# Patient Record
Sex: Female | Born: 1975 | Smoking: Never smoker
Health system: Southern US, Community
[De-identification: ages and names within clinical notes are randomized; demographics above are authoritative.]

## PROBLEM LIST (undated history)

## (undated) DIAGNOSIS — G43909 Migraine, unspecified, not intractable, without status migrainosus: Secondary | ICD-10-CM

## (undated) DIAGNOSIS — F419 Anxiety disorder, unspecified: Secondary | ICD-10-CM

## (undated) DIAGNOSIS — F329 Major depressive disorder, single episode, unspecified: Secondary | ICD-10-CM

## (undated) DIAGNOSIS — M797 Fibromyalgia: Secondary | ICD-10-CM

## (undated) DIAGNOSIS — F32A Depression, unspecified: Secondary | ICD-10-CM

---

## 2004-07-22 ENCOUNTER — Ambulatory Visit: Payer: Self-pay | Admitting: Internal Medicine

## 2004-07-23 ENCOUNTER — Emergency Department: Payer: Self-pay | Admitting: Emergency Medicine

## 2005-11-27 ENCOUNTER — Inpatient Hospital Stay: Payer: Self-pay | Admitting: Internal Medicine

## 2007-04-06 ENCOUNTER — Observation Stay: Payer: Self-pay | Admitting: Obstetrics and Gynecology

## 2007-04-30 ENCOUNTER — Inpatient Hospital Stay: Payer: Self-pay | Admitting: Obstetrics and Gynecology

## 2009-06-11 ENCOUNTER — Ambulatory Visit: Payer: Self-pay | Admitting: Family Medicine

## 2010-08-31 ENCOUNTER — Emergency Department: Payer: Self-pay | Admitting: *Deleted

## 2010-10-19 ENCOUNTER — Emergency Department: Payer: Self-pay | Admitting: Emergency Medicine

## 2012-07-16 IMAGING — US US OB < 14 WEEKS - US OB TV
1 series · 17 of 28 positions shown · non-contrast
Comparison: none

REASON FOR EXAM: pregnancy, bleeding
COMMENTS:

[Series 1: us ob < 14 weeks - us ob tv · 17 of 67 slices shown]
[im 1/67]
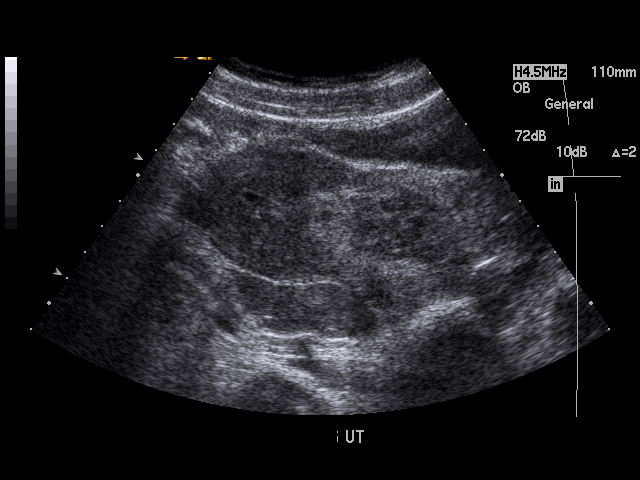
[im 5/67]
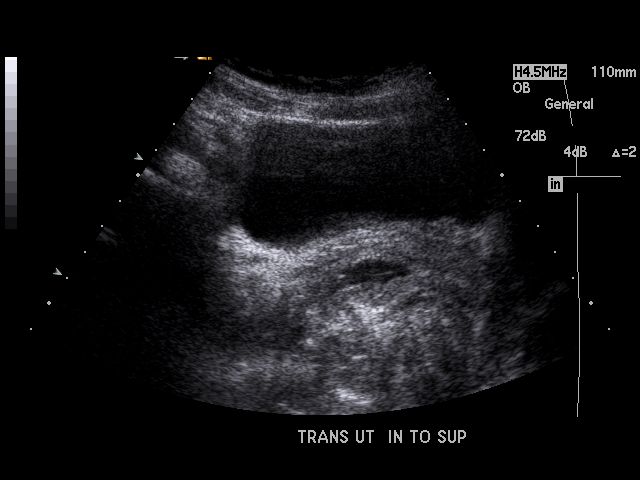
[im 10/67]
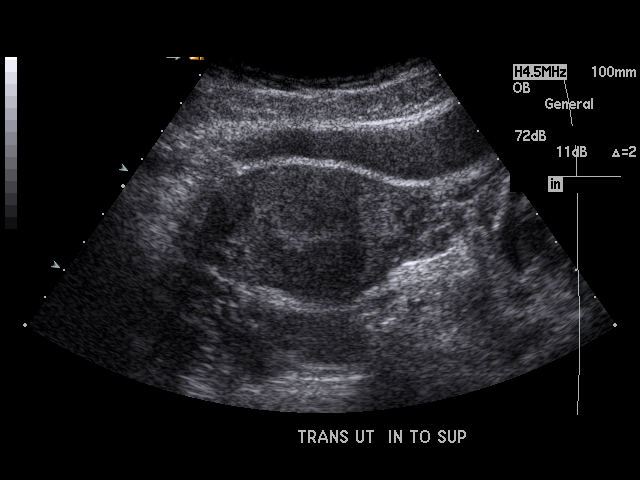
[im 13/67]
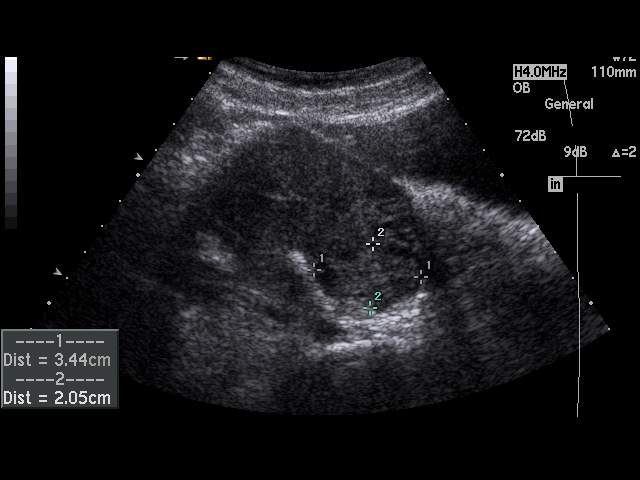
[im 18/67]
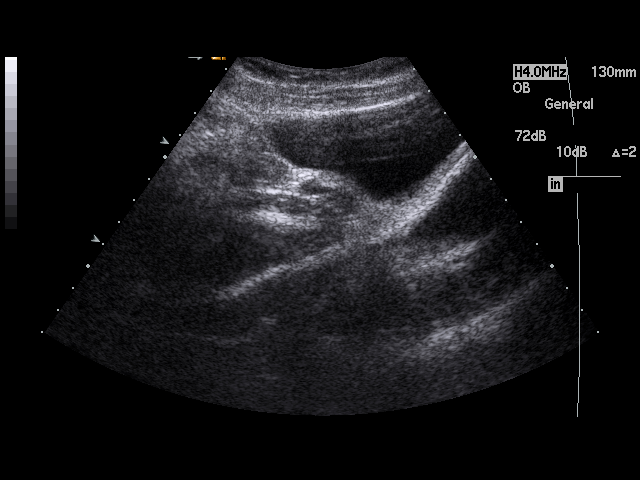
[im 23/67]
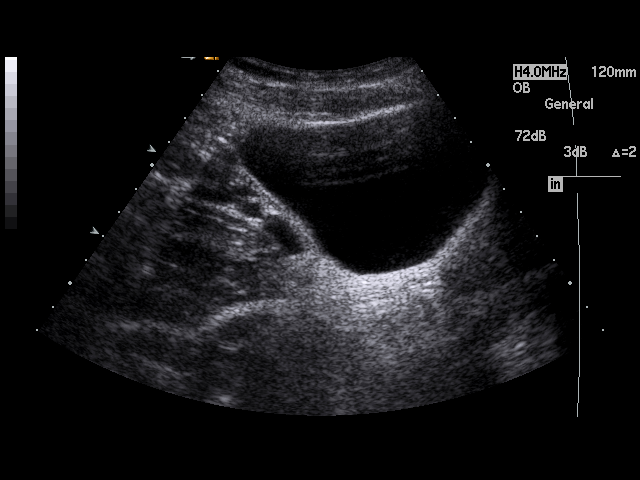
[im 25/67]
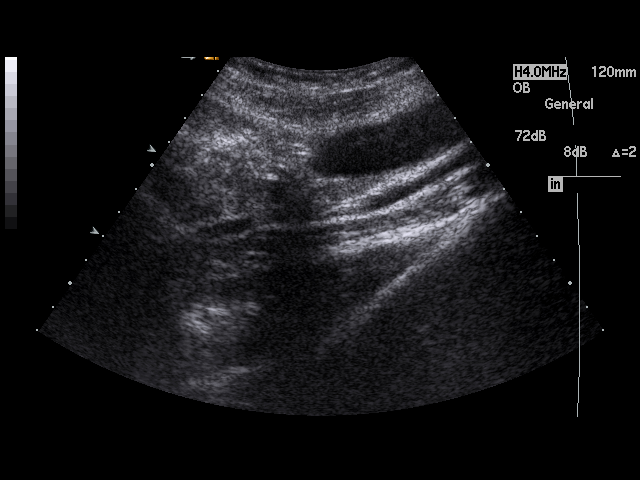
[im 30/67]
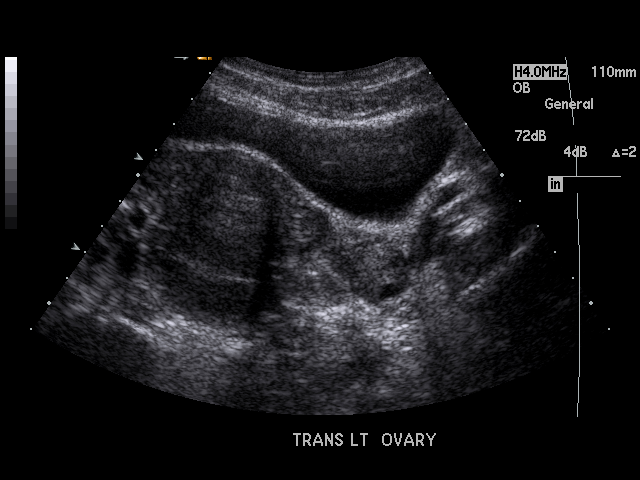
[im 35/67]
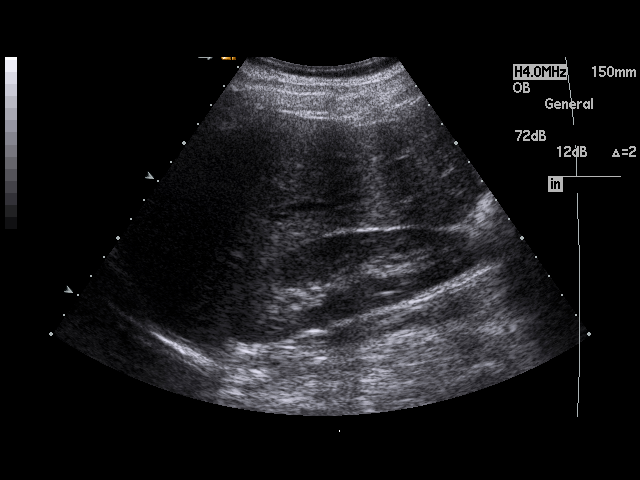
[im 37/67]
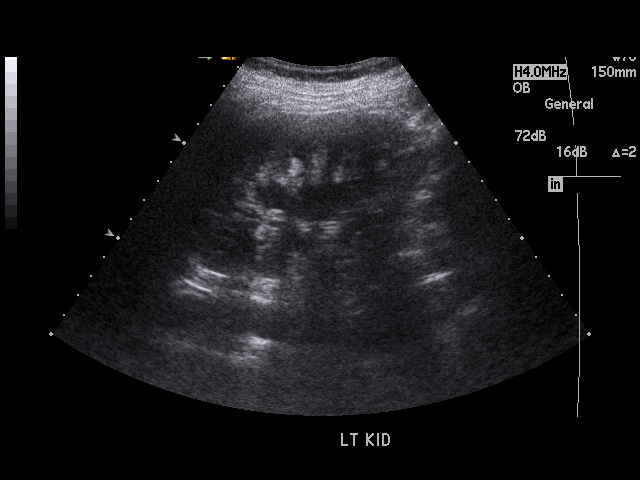
[im 42/67]
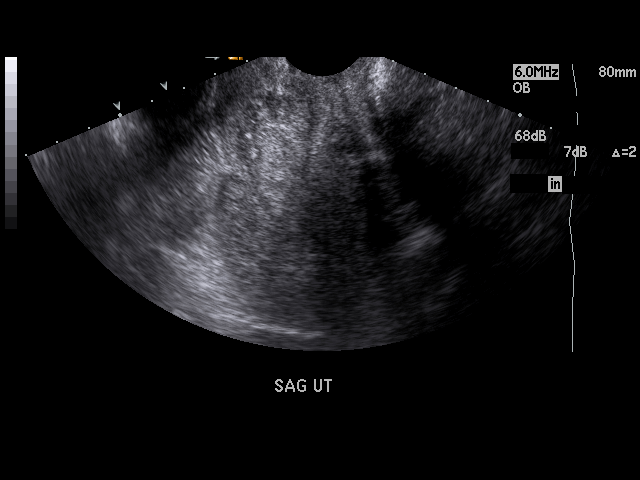
[im 45/67]
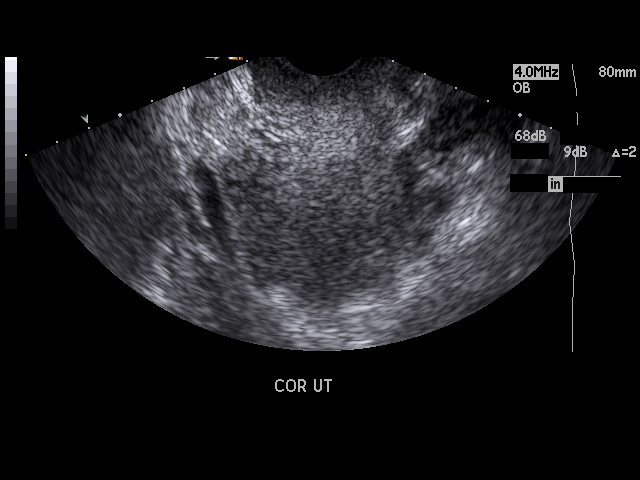
[im 49/67]
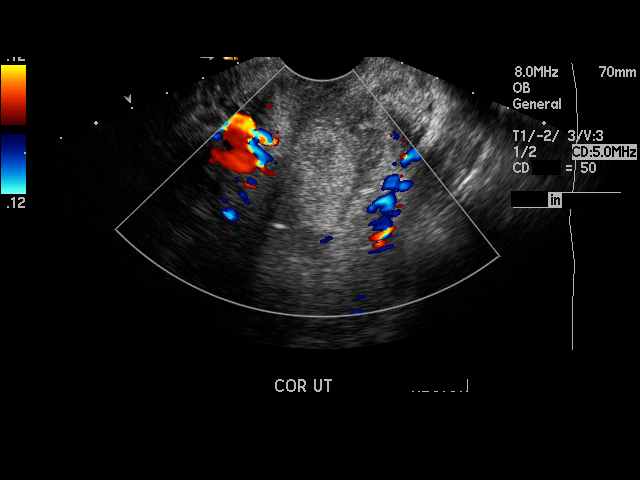
[im 54/67]
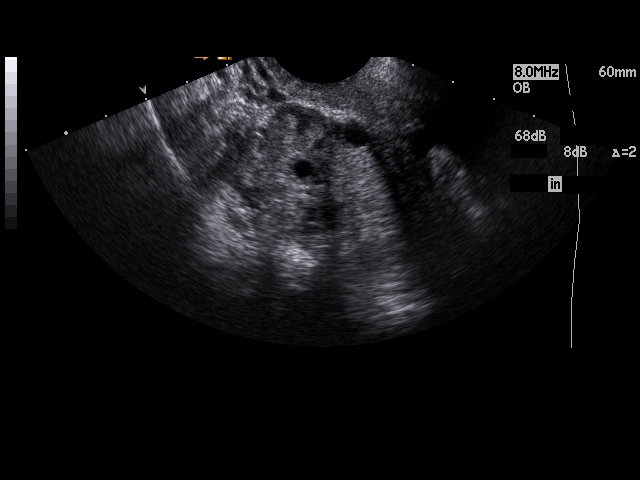
[im 57/67]
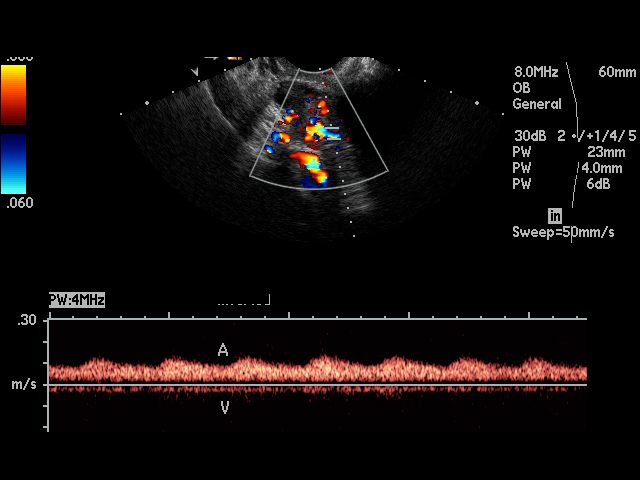
[im 62/67]
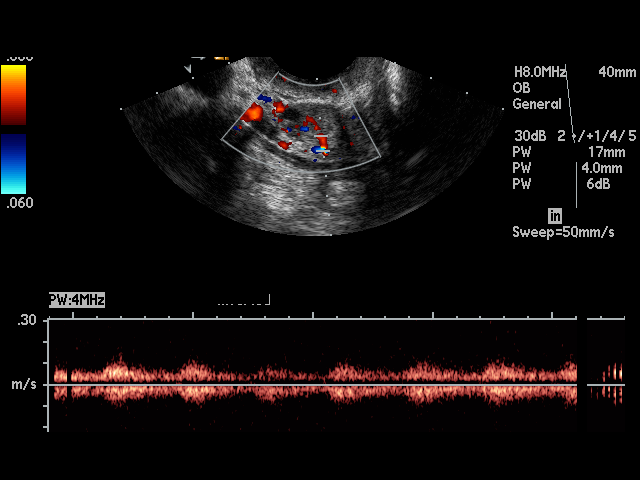
[im 67/67]
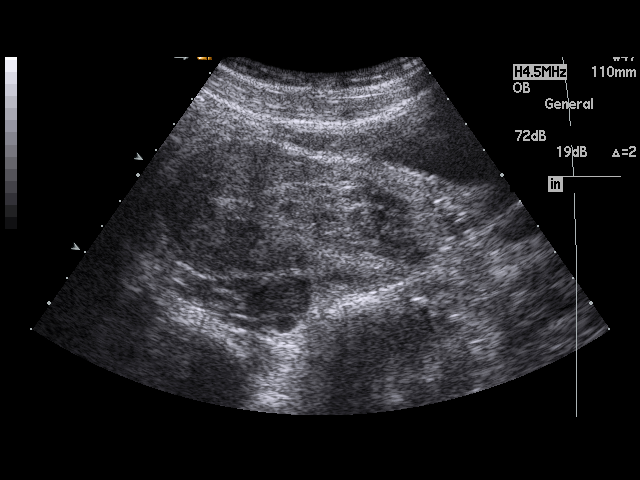

[17 of 28 positions shown; findings below may reference images not displayed]

PROCEDURE:     US  - US OB LESS THAN 14 WEEKS/W TRANS  - October 20, 2010 [DATE]

RESULT:

Transabdominal and endovaginal ultrasound is performed. The right kidney is
unremarkable. Mild, left hydronephrosis is present. The ovaries appear to be
normal in size and echotexture without evidence of torsion. No identifiable
intrauterine pregnancy is evident. Heterogeneous, hyperechogenic material is
demonstrated within the lower uterine segment measuring up to 3.3 mm. There
is no free fluid within the pelvis.
IMPRESSION: 1.  No identifiable intrauterine pregnancy. There may be residual blood
products within the lower uterine segment. Spontaneous abortion is the most
likely consideration. Given the positive Beta-HCG and no intrauterine
pregnancy, the possibility of an ectopic gestation cannot be excluded and
follow-up serial quantitative Beta-HCG is recommended.
2.  Mild, left hydronephrosis.

## 2015-02-16 ENCOUNTER — Encounter: Payer: Self-pay | Admitting: Emergency Medicine

## 2015-02-16 ENCOUNTER — Emergency Department: Payer: Medicare Other

## 2015-02-16 ENCOUNTER — Emergency Department
Admission: EM | Admit: 2015-02-16 | Discharge: 2015-02-16 | Disposition: A | Discharge status: Home / Self Care | Payer: Medicare Other | Attending: Emergency Medicine | Admitting: Emergency Medicine

## 2015-02-16 DIAGNOSIS — R51 Headache: Secondary | ICD-10-CM | POA: Insufficient documentation

## 2015-02-16 DIAGNOSIS — R002 Palpitations: Secondary | ICD-10-CM | POA: Diagnosis present

## 2015-02-16 DIAGNOSIS — F419 Anxiety disorder, unspecified: Secondary | ICD-10-CM | POA: Diagnosis not present

## 2015-02-16 DIAGNOSIS — G4700 Insomnia, unspecified: Secondary | ICD-10-CM | POA: Insufficient documentation

## 2015-02-16 DIAGNOSIS — R079 Chest pain, unspecified: Secondary | ICD-10-CM | POA: Insufficient documentation

## 2015-02-16 HISTORY — DX: Migraine, unspecified, not intractable, without status migrainosus: G43.909

## 2015-02-16 HISTORY — DX: Anxiety disorder, unspecified: F41.9

## 2015-02-16 HISTORY — DX: Major depressive disorder, single episode, unspecified: F32.9

## 2015-02-16 HISTORY — DX: Fibromyalgia: M79.7

## 2015-02-16 HISTORY — DX: Depression, unspecified: F32.A

## 2015-02-16 LAB — BASIC METABOLIC PANEL
ANION GAP: 4 — AB (ref 5–15)
BUN: 11 mg/dL (ref 6–20)
CALCIUM: 9.5 mg/dL (ref 8.9–10.3)
CHLORIDE: 105 mmol/L (ref 101–111)
CO2: 30 mmol/L (ref 22–32)
Creatinine, Ser: 0.91 mg/dL (ref 0.44–1.00)
GFR calc Af Amer: >60 mL/min (ref >60)
GFR calc non Af Amer: >60 mL/min (ref >60)
GLUCOSE: 95 mg/dL (ref 65–99)
POTASSIUM: 4.1 mmol/L (ref 3.5–5.1)
Sodium: 139 mmol/L (ref 135–145)

## 2015-02-16 LAB — CBC
HEMATOCRIT: 39.7 % (ref 35.0–47.0)
HEMOGLOBIN: 13.5 g/dL (ref 12.0–16.0)
MCH: 31.1 pg (ref 26.0–34.0)
MCHC: 34 g/dL (ref 32.0–36.0)
MCV: 91.3 fL (ref 80.0–100.0)
Platelets: 182 10*3/uL (ref 150–440)
RBC: 4.34 MIL/uL (ref 3.80–5.20)
RDW: 12.3 % (ref 11.5–14.5)
WBC: 3.6 10*3/uL (ref 3.6–11.0)

## 2015-02-16 LAB — TROPONIN I: Troponin I: <0.03 ng/mL (ref <0.031)

## 2015-02-16 MED ORDER — IBUPROFEN 800 MG PO TABS
ORAL_TABLET | ORAL | Status: AC
  Filled 2015-02-16: qty 1

## 2015-02-16 MED ORDER — LORAZEPAM 1 MG PO TABS: 1.0000 mg | ORAL_TABLET | Freq: Two times a day (BID) | ORAL | Status: AC

## 2015-02-16 MED ORDER — ZOLPIDEM TARTRATE 10 MG PO TABS: 10.0000 mg | ORAL_TABLET | Freq: Every evening | ORAL | Status: AC | PRN

## 2015-02-16 MED ORDER — IBUPROFEN 800 MG PO TABS
800.0000 mg | ORAL_TABLET | Freq: Once | ORAL | Status: AC
  Administered 2015-02-16: 800 mg via ORAL

## 2015-02-16 NOTE — ED Notes (Signed)
Pt to ed with c/o chest pain and palpitations today.  Pt also reports headache,  States hx of anxiety.

## 2015-02-16 NOTE — ED Provider Notes (Signed)
[facility] Emergency Department Provider Note     Time seen: ----------------------------------------- 7:55 PM on 02/16/2015 -----------------------------------------    I have reviewed the triage vital signs and the nursing notes.   HISTORY  Chief Complaint Palpitations    HPI Renee Nichols is a 40 y.o. female who presents ER with chest pain and palpitations today. Patient is also reporting headacheearlier. Patient states she has a lot of anxiety and doesn't sleep very well at night. Patient is tried taking natural supplements in the past this hasn't helped. She states she lost her son this year to violence.   Past Medical History  Diagnosis Date  . Migraines   . Anxiety   . Fibromyalgia   . Depression     There are no active problems to display for this patient.   History reviewed. No pertinent past surgical history.  Allergies Sulfa antibiotics  Social History Social History  Substance Use Topics  . Smoking status: Never Smoker   . Smokeless tobacco: None  . Alcohol Use: Yes    Review of Systems Constitutional: Negative for fever. Eyes: Negative for visual changes. ENT: Negative for sore throat. Cardiovascular: Positive for chest pain, palpitations Respiratory: Negative for shortness of breath. Gastrointestinal: Negative for abdominal pain, vomiting and diarrhea. Genitourinary: Negative for dysuria. Musculoskeletal: Negative for back pain. Skin: Negative for rash. Neurological: Negative for headaches, focal weakness or numbness. Psychiatric: Positive for anxiety  10-point ROS otherwise negative.  ____________________________________________   PHYSICAL EXAM:  VITAL SIGNS: ED Triage Vitals  Enc Vitals Group     BP 02/16/15 1848 119/85 mmHg     Pulse Rate 02/16/15 1848 96     Resp 02/16/15 1848 20     Temp 02/16/15 1848 98.9 F (37.2 C)     Temp Source 02/16/15 1848 Oral     SpO2 02/16/15 1848 100 %     Weight  02/16/15 1848 165 lb (74.844 kg)     Height 02/16/15 1848  (1.702 m)     Head Cir --      Peak Flow --      Pain Score 02/16/15 1849 7     Pain Loc --      Pain Edu? --      Excl. in GC? --     Constitutional: Alert and oriented. Mildly anxious, no acute distress Eyes: Conjunctivae are normal. PERRL. Normal extraocular movements. ENT   Head: Normocephalic and atraumatic.   Nose: No congestion/rhinnorhea.   Mouth/Throat: Mucous membranes are moist.   Neck: No stridor. Cardiovascular: Normal rate, regular rhythm. Normal and symmetric distal pulses are present in all extremities. No murmurs, rubs, or gallops. Respiratory: Normal respiratory effort without tachypnea nor retractions. Breath sounds are clear and equal bilaterally. No wheezes/rales/rhonchi. Gastrointestinal: Soft and nontender. No distention. No abdominal bruits.  Musculoskeletal: Nontender with normal range of motion in all extremities. No joint effusions.  No lower extremity tenderness nor edema. Neurologic:  Normal speech and language. No gross focal neurologic deficits are appreciated. Speech is normal. No gait instability. Skin:  Skin is warm, dry and intact. No rash noted. Psychiatric: Mood and affect are normal. Speech and behavior are normal. Patient exhibits appropriate insight and judgment. ____________________________________________  EKG: Interpreted by me. Normal sinus rhythm with rate of 93 bpm, normal PR interval, normal QRS, normal QT interval. Normal axis.  ____________________________________________  ED COURSE:  Pertinent labs & imaging results that were available during my care of the patient were reviewed by me and  considered in my medical decision making (see chart for details). Patient is in no acute distress, symptoms likely anxiety and sleep deprivation related. We'll check cardiac labs and reevaluate. ____________________________________________    LABS (pertinent  positives/negatives)  Labs Reviewed  BASIC METABOLIC PANEL - Abnormal; Notable for the following:    Anion gap 4 (*)    All other components within normal limits  TROPONIN I  CBC    RADIOLOGY  Chest x-ray IMPRESSION: No abnormality noted. ____________________________________________  FINAL ASSESSMENT AND PLAN  Palpitations, anxiety, insomnia  Plan: Patient with labs and imaging as dictated above. I advised the patient to exercise at least 4 days a week as this will help some with her anxiety. Also prescribed Ambien for her and Ativan to take as needed. She is stable for outpatient follow-up.   Emily Filbert, MD   Emily Filbert, MD 02/16/15 2004

## 2015-02-16 NOTE — Discharge Instructions (Signed)
Palpitations A palpitation is the feeling that your heartbeat is irregular or is faster than normal. It may feel like your heart is fluttering or skipping a beat. Palpitations are usually not a serious problem. However, in some cases, you may need further medical evaluation. CAUSES  Palpitations can be caused by:  Smoking.  Caffeine or other stimulants, such as diet pills or energy drinks.  Alcohol.  Stress and anxiety.  Strenuous physical activity.  Fatigue.  Certain medicines.  Heart disease, especially if you have a history of irregular heart rhythms (arrhythmias), such as atrial fibrillation, atrial flutter, or supraventricular tachycardia.  An improperly working pacemaker or defibrillator. DIAGNOSIS  To find the cause of your palpitations, your health care provider will take your medical history and perform a physical exam. Your health care provider may also have you take a test called an ambulatory electrocardiogram (ECG). An ECG records your heartbeat patterns over a 24-hour period. You may also have other tests, such as:  Transthoracic echocardiogram (TTE). During echocardiography, sound waves are used to evaluate how blood flows through your heart.  Transesophageal echocardiogram (TEE).  Cardiac monitoring. This allows your health care provider to monitor your heart rate and rhythm in real time.  Holter monitor. This is a portable device that records your heartbeat and can help diagnose heart arrhythmias. It allows your health care provider to track your heart activity for several days, if needed.  Stress tests by exercise or by giving medicine that makes the heart beat faster. TREATMENT  Treatment of palpitations depends on the cause of your symptoms and can vary greatly. Most cases of palpitations do not require any treatment other than time, relaxation, and monitoring your symptoms. Other causes, such as atrial fibrillation, atrial flutter, or supraventricular  tachycardia, usually require further treatment. HOME CARE INSTRUCTIONS   Avoid:  Caffeinated coffee, tea, soft drinks, diet pills, and energy drinks.  Chocolate.  Alcohol.  Stop smoking if you smoke.  Reduce your stress and anxiety. Things that can help you relax include:  A method of controlling things in your body, such as your heartbeats, with your mind (biofeedback).  Yoga.  Meditation.  Physical activity such as swimming, jogging, or walking.  Get plenty of rest and sleep. SEEK MEDICAL CARE IF:   You continue to have a fast or irregular heartbeat beyond 24 hours.  Your palpitations occur more often. SEEK IMMEDIATE MEDICAL CARE IF:  You have chest pain or shortness of breath.  You have a severe headache.  You feel dizzy or you faint. MAKE SURE YOU:  Understand these instructions.  Will watch your condition.  Will get help right away if you are not doing well or get worse.   This information is not intended to replace advice given to you by your health care provider. Make sure you discuss any questions you have with your health care provider.   Document Released: 12/21/1999 Document Revised: 12/28/2012 Document Reviewed: 02/21/2011 Elsevier Interactive Patient Education 2016 [employer]  Panic Attacks Panic attacks are sudden, short-livedsurges of severe anxiety, fear, or discomfort. They may occur for no reason when you are relaxed, when you are anxious, or when you are sleeping. Panic attacks may occur for a number of reasons:   Healthy people occasionally have panic attacks in extreme, life-threatening situations, such as war or natural disasters. Normal anxiety is a protective mechanism of the body that helps us react to danger (fight or flight response).  Panic attacks are often seen with anxiety disorders, such  as panic disorder, social anxiety disorder, generalized anxiety disorder, and phobias. Anxiety disorders cause excessive or uncontrollable  anxiety. They may interfere with your relationships or other life activities. °· Panic attacks are sometimes seen with other mental illnesses, such as depression and posttraumatic stress disorder. °· Certain medical conditions, prescription medicines, and drugs of abuse can cause panic attacks. °SYMPTOMS  °Panic attacks start suddenly, peak within 20 minutes, and are accompanied by four or more of the following symptoms: °· Pounding heart or fast heart rate (palpitations). °· Sweating. °· Trembling or shaking. °· Shortness of breath or feeling smothered. °· Feeling choked. °· Chest pain or discomfort. °· Nausea or strange feeling in your stomach. °· Dizziness, light-headedness, or feeling like you will faint. °· Chills or hot flushes. °· Numbness or tingling in your lips or hands and feet. °· Feeling that things are not real or feeling that you are not yourself. °· Fear of losing control or going crazy. °· Fear of dying. °Some of these symptoms can mimic serious medical conditions. For example, you may think you are having a heart attack. Although panic attacks can be very scary, they are not life threatening. °DIAGNOSIS  °Panic attacks are diagnosed through an assessment by your health care provider. Your health care provider will ask questions about your symptoms, such as where and when they occurred. Your health care provider will also ask about your medical history and use of alcohol and drugs, including prescription medicines. Your health care provider may order blood tests or other studies to rule out a serious medical condition. Your health care provider may refer you to a mental health professional for further evaluation. °TREATMENT  °· Most healthy people who have one or two panic attacks in an extreme, life-threatening situation will not require treatment. °· The treatment for panic attacks associated with anxiety disorders or other mental illness typically involves counseling with a mental health  professional, medicine, or a combination of both. Your health care provider will help determine what treatment is best for you. °· Panic attacks due to physical illness usually go away with treatment of the illness. If prescription medicine is causing panic attacks, talk with your health care provider about stopping the medicine, decreasing the dose, or substituting another medicine. °· Panic attacks due to alcohol or drug abuse go away with abstinence. Some adults need professional help in order to stop drinking or using drugs. °HOME CARE INSTRUCTIONS  °· Take all medicines as directed by your health care provider.   °· Schedule and attend follow-up visits as directed by your health care provider. It is important to keep all your appointments. °SEEK MEDICAL CARE IF: °· You are not able to take your medicines as prescribed. °· Your symptoms do not improve or get worse. °SEEK IMMEDIATE MEDICAL CARE IF:  °· You experience panic attack symptoms that are different than your usual symptoms. °· You have serious thoughts about hurting yourself or others. °· You are taking medicine for panic attacks and have a serious side effect. °MAKE SURE YOU: °· Understand these instructions. °· Will watch your condition. °· Will get help right away if you are not doing well or get worse. °  °This information is not intended to replace advice given to you by your health care provider. Make sure you discuss any questions you have with your health care provider. °  °Document Released: 12/23/2004 Document Revised: 12/28/2012 Document Reviewed: 08/06/2012 °Elsevier Interactive Patient Education ©2016 [employer] ° °

## 2016-11-13 IMAGING — CR DG CHEST 2V
1 series · 2 of 2 positions shown · non-contrast
Comparison: None.

CLINICAL DATA: Chest pain and cardiac palpitations for 1 day

EXAM:
CHEST  2 VIEW

[Series 1: dg chest 2 view · 0.14mm/px · 2 of 2 slices shown]
[im 1/2]
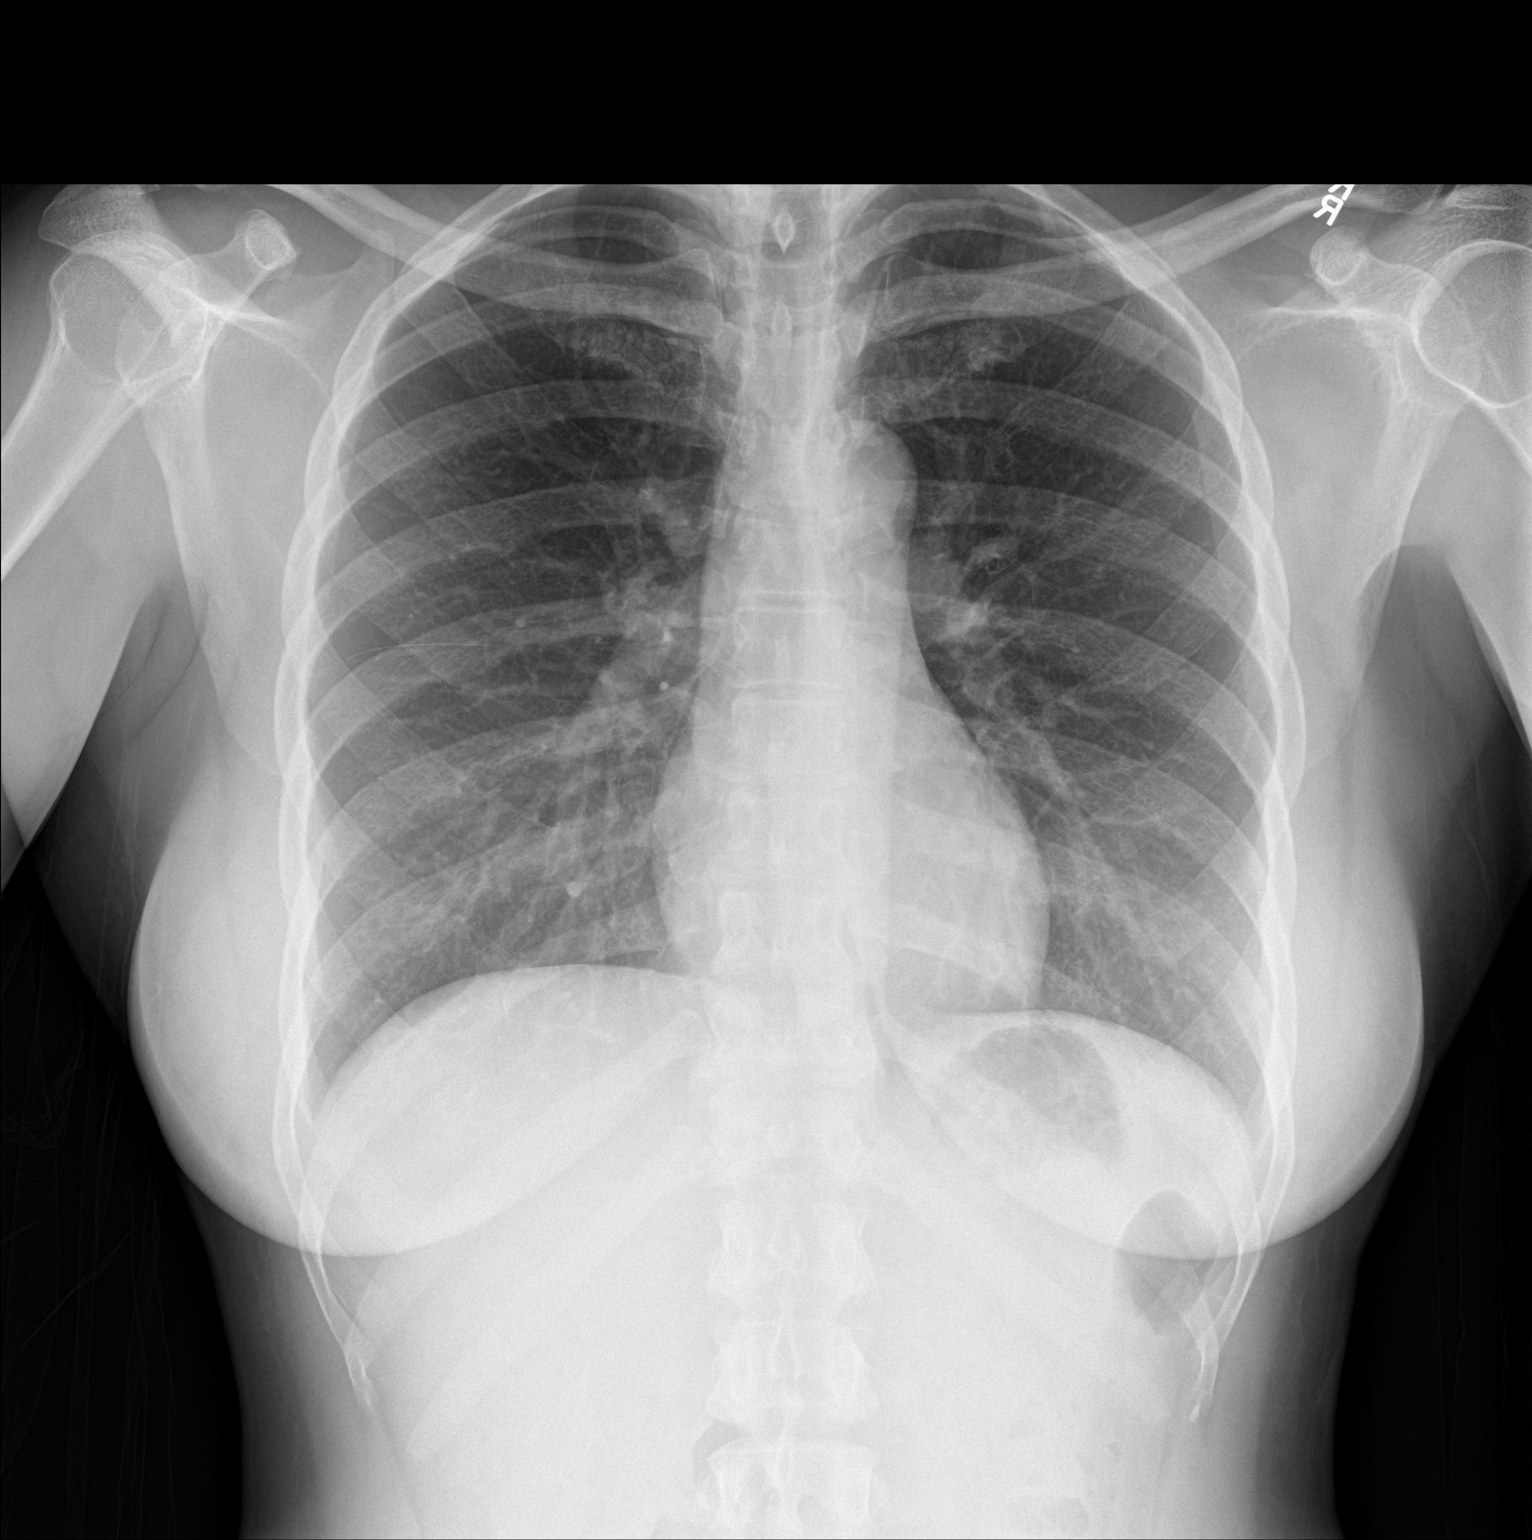
[im 2/2]
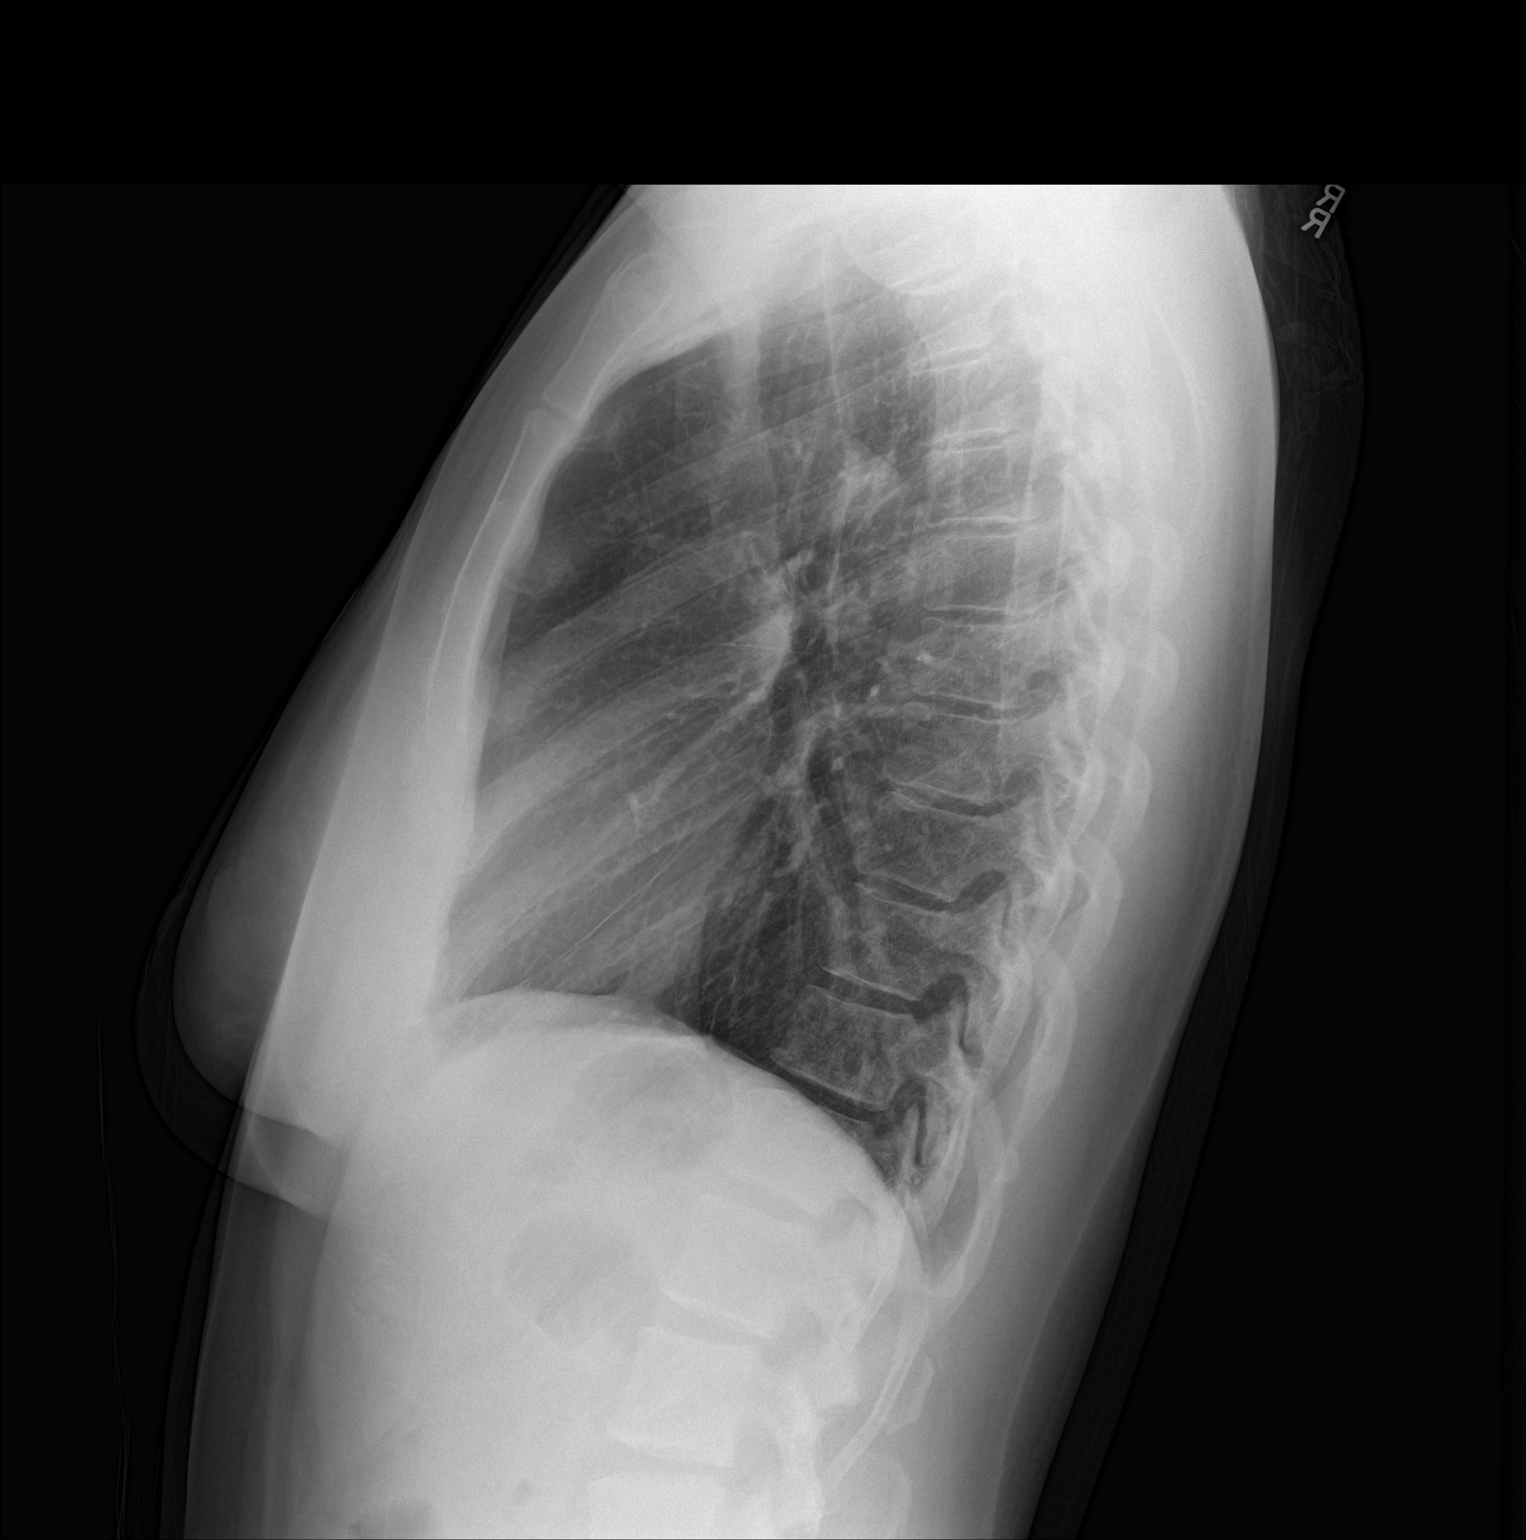

[2 of 2 positions shown; findings below may reference images not displayed]

FINDINGS: Lungs are clear. Heart size and pulmonary vascularity are normal. No
adenopathy. No pneumothorax. No bone lesions.
IMPRESSION: No abnormality noted.
